# Patient Record
Sex: Female | Born: 1985 | Race: White | Hispanic: No | Marital: Single | Smoking: Current every day smoker
Health system: Southern US, Community
[De-identification: ages and names within clinical notes are randomized; demographics above are authoritative.]

## PROBLEM LIST (undated history)

## (undated) DIAGNOSIS — J45909 Unspecified asthma, uncomplicated: Secondary | ICD-10-CM

## (undated) DIAGNOSIS — K219 Gastro-esophageal reflux disease without esophagitis: Secondary | ICD-10-CM

---
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
## 2009-01-07 ENCOUNTER — Emergency Department (HOSPITAL_COMMUNITY): Admission: EM | Admit: 2009-01-07 | Discharge: 2009-01-07 | Payer: Self-pay | Admitting: Family Medicine
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
## 2014-03-14 ENCOUNTER — Other Ambulatory Visit (HOSPITAL_COMMUNITY): Payer: Self-pay | Admitting: Nurse Practitioner

## 2014-03-14 DIAGNOSIS — N949 Unspecified condition associated with female genital organs and menstrual cycle: Secondary | ICD-10-CM

## 2014-03-16 ENCOUNTER — Other Ambulatory Visit (HOSPITAL_COMMUNITY): Payer: Self-pay

## 2018-09-30 ENCOUNTER — Ambulatory Visit (INDEPENDENT_AMBULATORY_CARE_PROVIDER_SITE_OTHER): Payer: BLUE CROSS/BLUE SHIELD | Admitting: Internal Medicine

## 2018-09-30 ENCOUNTER — Encounter (INDEPENDENT_AMBULATORY_CARE_PROVIDER_SITE_OTHER): Payer: Self-pay | Admitting: Internal Medicine

## 2018-09-30 ENCOUNTER — Other Ambulatory Visit: Payer: Self-pay

## 2018-09-30 VITALS — BP 117/79 | HR 93 | Temp 98.7°F | Ht 63.0 in | Wt 222.5 lb

## 2018-09-30 DIAGNOSIS — R197 Diarrhea, unspecified: Secondary | ICD-10-CM | POA: Diagnosis not present

## 2018-09-30 NOTE — Progress Notes (Signed)
   Subjective:    Patient ID: Natasha Bright, female    DOB: [birth year], 33 y.o.   MRN: [id number]  HPI Referred by the [facility] for diarrhea. She has diarrhea everyday for a month. She said she had diarrhea everyday in February. She also had diarrhea in December. On average, she has 2-3 loose stools a day. She had diarrhea x 2 yesterday.  She has urgency. She is not taking a Probiotic.No recent antibiotics. She says she has a dull ache in her rt lower abdomen at times.   Review of Systems History reviewed. No pertinent past medical history.  History reviewed. No pertinent surgical history.  Allergies  Allergen Reactions  . Lactose Intolerance (Gi)     Current Outpatient Medications on File Prior to Visit  Medication Sig Dispense Refill  . Levonorgest-Eth Estrad 91-Day (ASHLYNA PO) Take by mouth.    . meloxicam (MOBIC) 7.5 MG tablet Take 7.5 mg by mouth as needed for pain.     No current facility-administered medications on file prior to visit.         Objective:   Physical Exam Blood pressure 117/79, pulse 93, temperature 98.7 F (37.1 C), height 5\' 3"  (1.6 m), weight 222 lb 8 oz (100.9 kg). Alert and oriented. Skin warm and dry. Oral mucosa is moist.   . Sclera anicteric, conjunctivae is pink. Thyroid not enlarged. No cervical lymphadenopathy. Lungs clear. Heart regular rate and rhythm.  Abdomen is soft. Bowel sounds are positive. No hepatomegaly. No abdominal masses felt. No tenderness.  No edema to lower extremities.         Assessment & Plan:  Diarrhea. Am going to get a GI pathogen. Needs to start a Probiotic Further recommendations to follow.

## 2018-09-30 NOTE — Patient Instructions (Addendum)
GI pathogen. Get a probiotic

## 2019-10-28 ENCOUNTER — Ambulatory Visit: Payer: Self-pay | Attending: Internal Medicine

## 2019-10-28 DIAGNOSIS — Z23 Encounter for immunization: Secondary | ICD-10-CM

## 2019-10-28 NOTE — Progress Notes (Signed)
   Covid-19 Vaccination Clinic  Name:  Natasha Bright    MRN: [id number] DOB: [birth year]  10/28/2019  [name] was observed post Covid-19 immunization for 15 minutes without incident. She was provided with Vaccine Information Sheet and instruction to access the V-Safe system.   [name] was instructed to call 911 with any severe reactions post vaccine: Marland Kitchen Difficulty breathing  . Swelling of face and throat  . A fast heartbeat  . A bad rash all over body  . Dizziness and weakness   Immunizations Administered    Name Date Dose VIS Date Route   Pfizer COVID-19 Vaccine 10/28/2019  4:55 PM 0.3 mL 06/30/2019 Intramuscular   Manufacturer: [employer], [employer]   Lot: [id number]   NDC: [address]-9

## 2019-11-20 ENCOUNTER — Ambulatory Visit: Payer: Self-pay | Attending: Internal Medicine

## 2019-11-20 DIAGNOSIS — Z23 Encounter for immunization: Secondary | ICD-10-CM

## 2019-11-20 NOTE — Progress Notes (Signed)
   Covid-19 Vaccination Clinic  Name:  Natasha Bright    MRN: [id number] DOB: [birth year]  11/20/2019  [name] was observed post Covid-19 immunization for 15 minutes without incident. She was provided with Vaccine Information Sheet and instruction to access the V-Safe system.   [name] was instructed to call 911 with any severe reactions post vaccine: Marland Kitchen Difficulty breathing  . Swelling of face and throat  . A fast heartbeat  . A bad rash all over body  . Dizziness and weakness   Immunizations Administered    Name Date Dose VIS Date Route   Pfizer COVID-19 Vaccine 11/20/2019  1:41 PM 0.3 mL 09/13/2018 Intramuscular   Manufacturer: [employer], [employer]   Lot: [id number]   NDC: [address]-5

## 2021-10-28 ENCOUNTER — Ambulatory Visit
Admission: EM | Admit: 2021-10-28 | Discharge: 2021-10-28 | Disposition: A | Discharge status: Home / Self Care | Payer: 59 | Attending: Urgent Care | Admitting: Urgent Care

## 2021-10-28 ENCOUNTER — Encounter: Payer: Self-pay | Admitting: Emergency Medicine

## 2021-10-28 DIAGNOSIS — J019 Acute sinusitis, unspecified: Secondary | ICD-10-CM | POA: Diagnosis not present

## 2021-10-28 DIAGNOSIS — R0981 Nasal congestion: Secondary | ICD-10-CM | POA: Diagnosis not present

## 2021-10-28 DIAGNOSIS — H9203 Otalgia, bilateral: Secondary | ICD-10-CM | POA: Diagnosis not present

## 2021-10-28 HISTORY — DX: Gastro-esophageal reflux disease without esophagitis: K21.9

## 2021-10-28 MED ORDER — DOXYCYCLINE HYCLATE 100 MG PO CAPS: 100.0000 mg | ORAL_CAPSULE | Freq: Two times a day (BID) | ORAL | 0 refills | Status: AC

## 2021-10-28 MED ORDER — LEVOCETIRIZINE DIHYDROCHLORIDE 5 MG PO TABS: 5.0000 mg | ORAL_TABLET | Freq: Every evening | ORAL | 0 refills | Status: AC

## 2021-10-28 NOTE — ED Provider Notes (Signed)
?[facility] ? ? ?MRN: [id number] DOB: 1985/11/27 ? ?Subjective:  ? ?Natasha Bright is a 36 y.o. female presenting for 2-week history of persistent sinus congestion, bilateral ear pain, sinus pressure, drainage, coughing.  Patient completed a round of antibiotics that she took for a tooth infection.  Has been using pseudoephedrine daily.  Reports that she gets bronchitis yearly but has not had this problem in the past couple of years.  No smoking.  No history of asthma.  No chest pain, shortness of breath or wheezing. ? ?No current facility-administered medications for this encounter. ? ?Current Outpatient Medications:  ?  Levonorgest-Eth Estrad 91-Day (ASHLYNA PO), Take by mouth., Disp: , Rfl:  ?  meloxicam (MOBIC) 7.5 MG tablet, Take 7.5 mg by mouth as needed for pain., Disp: , Rfl:   ? ?Allergies  ?Allergen Reactions  ? Lactose Intolerance (Gi)   ? ? ?Past Medical History:  ?Diagnosis Date  ? Acid reflux   ?  ? ?History reviewed. No pertinent surgical history. ? ?History reviewed. No pertinent family history. ? ?Social History  ? ?Tobacco Use  ? Smoking status: Never  ? Smokeless tobacco: Never  ?Substance Use Topics  ? Alcohol use: Yes  ?  Comment: twice a week or more  ? Drug use: Never  ? ? ?ROS ? ? ?Objective:  ? ?Vitals: ?BP (!) 163/108 (BP Location: Right Arm)   Pulse 85   Temp 98.1 ?F (36.7 ?C) (Oral)   Resp 18   SpO2 98%  ? ?Physical Exam ?Constitutional:   ?   General: She is not in acute distress. ?   Appearance: Normal appearance. She is well-developed and normal weight. She is not ill-appearing, toxic-appearing or diaphoretic.  ?HENT:  ?   Head: Normocephalic and atraumatic.  ?   Right Ear: Tympanic membrane, ear canal and external ear normal. No drainage or tenderness. No middle ear effusion. There is no impacted cerumen. Tympanic membrane is not erythematous.  ?   Left Ear: Tympanic membrane, ear canal and external ear normal. No drainage or tenderness.  No middle ear effusion.  There is no impacted cerumen. Tympanic membrane is not erythematous.  ?   Nose: Nose normal. No congestion or rhinorrhea.  ?   Mouth/Throat:  ?   Mouth: Mucous membranes are moist. No oral lesions.  ?   Pharynx: No pharyngeal swelling, oropharyngeal exudate, posterior oropharyngeal erythema or uvula swelling.  ?   Tonsils: No tonsillar exudate or tonsillar abscesses.  ?Eyes:  ?   General: No scleral icterus.    ?   Right eye: No discharge.     ?   Left eye: No discharge.  ?   Extraocular Movements: Extraocular movements intact.  ?   Right eye: Normal extraocular motion.  ?   Left eye: Normal extraocular motion.  ?   Conjunctiva/sclera: Conjunctivae normal.  ?Cardiovascular:  ?   Rate and Rhythm: Normal rate.  ?   Heart sounds: No murmur heard. ?  No friction rub. No gallop.  ?Pulmonary:  ?   Effort: Pulmonary effort is normal. No respiratory distress.  ?   Breath sounds: No stridor. No wheezing, rhonchi or rales.  ?Chest:  ?   Chest wall: No tenderness.  ?Musculoskeletal:  ?   Cervical back: Normal range of motion and neck supple.  ?Lymphadenopathy:  ?   Cervical: No cervical adenopathy.  ?Skin: ?   General: Skin is warm and dry.  ?Neurological:  ?   General: No focal  deficit present.  ?   Mental Status: She is alert and oriented to person, place, and time.  ?Psychiatric:     ?   Mood and Affect: Mood normal.     ?   Behavior: Behavior normal.  ? ? ?Assessment and Plan :  ? ?PDMP not reviewed this encounter. ? ?1. Acute non-recurrent sinusitis, unspecified location   ?2. Sinus congestion   ?3. Acute ear pain, bilateral   ? ? ?No signs of otitis media. Deferred imaging given clear cardiopulmonary exam, hemodynamically stable vital signs. Will start empiric treatment for sinusitis with doxycycline.  Recommended supportive care otherwise including the use of oral antihistamine, decongestant. Counseled patient on potential for adverse effects with medications prescribed/recommended today, ER and return-to-clinic  precautions discussed, patient verbalized understanding. ? ?  ?Wallis Bamberg, PA-C ?10/28/21 1227 ? ?

## 2021-10-28 NOTE — ED Triage Notes (Signed)
Hx of covid 2 months ago.  Just finished amoxil for tooth problem.  Runny nose sneezing, cough and scratchy throat.  Has been taking sudafed with out relief.   ?

## 2023-04-09 ENCOUNTER — Encounter: Payer: Self-pay | Admitting: Emergency Medicine

## 2023-04-09 ENCOUNTER — Ambulatory Visit
Admission: EM | Admit: 2023-04-09 | Discharge: 2023-04-09 | Disposition: A | Discharge status: Home / Self Care | Payer: Self-pay | Attending: Family Medicine | Admitting: Family Medicine

## 2023-04-09 ENCOUNTER — Other Ambulatory Visit: Payer: Self-pay

## 2023-04-09 DIAGNOSIS — S161XXA Strain of muscle, fascia and tendon at neck level, initial encounter: Secondary | ICD-10-CM

## 2023-04-09 DIAGNOSIS — N39498 Other specified urinary incontinence: Secondary | ICD-10-CM

## 2023-04-09 HISTORY — DX: Unspecified asthma, uncomplicated: J45.909

## 2023-04-09 LAB — POCT URINALYSIS DIP (MANUAL ENTRY)
Bilirubin, UA: NEGATIVE
Blood, UA: NEGATIVE
Glucose, UA: NEGATIVE mg/dL
Ketones, POC UA: NEGATIVE mg/dL
Leukocytes, UA: NEGATIVE
Nitrite, UA: NEGATIVE
Protein Ur, POC: NEGATIVE mg/dL
Spec Grav, UA: 1.02 (ref 1.010–1.025)
Urobilinogen, UA: 0.2 E.U./dL
pH, UA: 7.5 (ref 5.0–8.0)

## 2023-04-09 MED ORDER — NAPROXEN 500 MG PO TABS: 500.0000 mg | ORAL_TABLET | Freq: Two times a day (BID) | ORAL | 0 refills | Status: AC | PRN

## 2023-04-09 MED ORDER — TIZANIDINE HCL 4 MG PO CAPS: 4.0000 mg | ORAL_CAPSULE | Freq: Three times a day (TID) | ORAL | 0 refills | Status: AC | PRN
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
## 2023-04-18 ENCOUNTER — Other Ambulatory Visit: Payer: Self-pay | Admitting: Family Medicine

## 2023-04-19 NOTE — Telephone Encounter (Signed)
Urgent Care patient Requested Prescriptions  Pending Prescriptions Disp Refills   tiZANidine (ZANAFLEX) 4 MG tablet [Pharmacy Med Name: TIZANIDINE 4MG  TABLETS] 15 tablet     Sig: TAKE 1 TABLET BY MOUTH THREE TIMES DAILY AS NEEDED FOR MUSCLE SPASMS. DO NOT DRINK ALCOHOL OR DRIVE WHILE TAKING THIS. MAY CAUSE DROWSINESS     There is no refill protocol information for this order

## 2023-12-14 ENCOUNTER — Ambulatory Visit
Admission: EM | Admit: 2023-12-14 | Discharge: 2023-12-14 | Disposition: A | Discharge status: Home / Self Care | Attending: Family Medicine | Admitting: Family Medicine

## 2023-12-14 DIAGNOSIS — J0100 Acute maxillary sinusitis, unspecified: Secondary | ICD-10-CM | POA: Diagnosis not present

## 2023-12-14 DIAGNOSIS — J4531 Mild persistent asthma with (acute) exacerbation: Secondary | ICD-10-CM | POA: Diagnosis not present

## 2023-12-14 DIAGNOSIS — M25471 Effusion, right ankle: Secondary | ICD-10-CM

## 2023-12-14 DIAGNOSIS — J3089 Other allergic rhinitis: Secondary | ICD-10-CM | POA: Diagnosis not present

## 2023-12-14 DIAGNOSIS — M25472 Effusion, left ankle: Secondary | ICD-10-CM

## 2023-12-14 MED ORDER — PROMETHAZINE-DM 6.25-15 MG/5ML PO SYRP: 5.0000 mL | ORAL_SOLUTION | Freq: Four times a day (QID) | ORAL | 0 refills | Status: AC | PRN

## 2023-12-14 MED ORDER — ALBUTEROL SULFATE HFA 108 (90 BASE) MCG/ACT IN AERS: 2.0000 | INHALATION_SPRAY | RESPIRATORY_TRACT | 0 refills | Status: AC | PRN

## 2023-12-14 MED ORDER — DEXAMETHASONE SODIUM PHOSPHATE 10 MG/ML IJ SOLN
10.0000 mg | Freq: Once | INTRAMUSCULAR | Status: AC
  Administered 2023-12-14: 10 mg via INTRAMUSCULAR

## 2023-12-14 MED ORDER — CETIRIZINE HCL 10 MG PO TABS: 10.0000 mg | ORAL_TABLET | Freq: Every day | ORAL | 2 refills | Status: AC

## 2023-12-14 MED ORDER — ALBUTEROL SULFATE (2.5 MG/3ML) 0.083% IN NEBU
2.5000 mg | INHALATION_SOLUTION | Freq: Once | RESPIRATORY_TRACT | Status: AC
  Administered 2023-12-14: 2.5 mg via RESPIRATORY_TRACT

## 2023-12-14 MED ORDER — AZELASTINE HCL 0.1 % NA SOLN: 1.0000 | Freq: Two times a day (BID) | NASAL | 2 refills | Status: AC

## 2023-12-14 MED ORDER — AMOXICILLIN-POT CLAVULANATE 875-125 MG PO TABS: 1.0000 | ORAL_TABLET | Freq: Two times a day (BID) | ORAL | 0 refills | Status: AC

## 2023-12-14 NOTE — ED Triage Notes (Signed)
 Pt reports difficulty breathing,cough and congestion, x 1 week sx's seemed to have gotten worse on Friday.

## 2023-12-14 NOTE — ED Provider Notes (Signed)
 [facility]    CSN: [id number] Arrival date & time: 12/14/23  0945      History   Chief Complaint No chief complaint on file.   HPI Natasha Bright is a 38 y.o. female.   Patient presenting today with over a week of progressively worsening nasal congestion, facial pain and pressure, productive cough, chest tightness, wheezing, shortness of breath, ear pressure.  Denies fever, chest pain, abdominal pain, vomiting, diarrhea.  History of seasonal allergies and asthma not currently on regimen for these.  She is also complaining of bilateral ankle and foot swelling worse during the day while she is up on her feet for the past few weeks.  Notes she has traveled multiple times over the timeframe and has been on her feet quite often throughout the days.  Denies redness, rashes, numbness, tingling, pain and states both sides are swollen to the same degree.  No known history of DVT/PE.    Past Medical History:  Diagnosis Date   Acid reflux    Asthma    sports induced as a child.    There are no active problems to display for this patient.   History reviewed. No pertinent surgical history.  OB History   No obstetric history on file.      Home Medications    Prior to Admission medications   Medication Sig Start Date End Date Taking? Authorizing Provider  albuterol (VENTOLIN HFA) 108 (90 Base) MCG/ACT inhaler Inhale 2 puffs into the lungs every 4 (four) hours as needed. 12/14/23  Yes Corbin Dess, PA-C  amoxicillin-clavulanate (AUGMENTIN) 875-125 MG tablet Take 1 tablet by mouth every 12 (twelve) hours. 12/14/23  Yes Corbin Dess, PA-C  azelastine (ASTELIN) 0.1 % nasal spray Place 1 spray into both nostrils 2 (two) times daily. Use in each nostril as directed 12/14/23  Yes Corbin Dess, PA-C  cetirizine (ZYRTEC ALLERGY) 10 MG tablet Take 1 tablet (10 mg total) by mouth daily. 12/14/23  Yes Corbin Dess, PA-C   promethazine-dextromethorphan (PROMETHAZINE-DM) 6.25-15 MG/5ML syrup Take 5 mLs by mouth 4 (four) times daily as needed. 12/14/23  Yes Corbin Dess, PA-C  doxycycline  (VIBRAMYCIN ) 100 MG capsule Take 1 capsule (100 mg total) by mouth 2 (two) times daily. 10/28/21   Adolph Hoop, PA-C  levocetirizine (XYZAL ) 5 MG tablet Take 1 tablet (5 mg total) by mouth every evening. 10/28/21   Adolph Hoop, PA-C  Levonorgest-Eth Estrad 91-Day (ASHLYNA PO) Take by mouth. Patient not taking: Reported on 04/09/2023    [provider]  meloxicam (MOBIC) 7.5 MG tablet Take 7.5 mg by mouth as needed for pain.    [provider]  naproxen  (NAPROSYN ) 500 MG tablet Take 1 tablet (500 mg total) by mouth 2 (two) times daily as needed. 04/09/23   Corbin Dess, PA-C  tiZANidine  (ZANAFLEX ) 4 MG capsule Take 1 capsule (4 mg total) by mouth 3 (three) times daily as needed for muscle spasms. Do not drink alcohol or drive while taking this medication.  May cause drowsiness. 04/09/23   Corbin Dess, PA-C    Family History History reviewed. No pertinent family history.  Social History Social History   Tobacco Use   Smoking status: Every Day    Types: Cigarettes   Smokeless tobacco: Never  Substance Use Topics   Alcohol use: Yes    Comment: twice a week or more   Drug use: Never     Allergies   Lactose intolerance (gi)  Review of Systems Review of Systems PER HPI  Physical Exam Triage Vital Signs ED Triage Vitals  Encounter Vitals Group     BP 12/14/23 0950 (!) 169/83     Systolic BP Percentile --      Diastolic BP Percentile --      Pulse Rate 12/14/23 0950 89     Resp 12/14/23 0950 16     Temp 12/14/23 0950 98.2 F (36.8 C)     Temp Source 12/14/23 0950 Oral     SpO2 12/14/23 0950 93 %     Weight --      Height --      Head Circumference --      Peak Flow --      Pain Score 12/14/23 0956 5     Pain Loc --      Pain Education --      Exclude from Growth  Chart --    No data found.  Updated Vital Signs BP (!) 169/83 (BP Location: Right Arm)   Pulse 89   Temp 98.2 F (36.8 C) (Oral)   Resp 16   SpO2 93%   Visual Acuity Right Eye Distance:   Left Eye Distance:   Bilateral Distance:    Right Eye Near:   Left Eye Near:    Bilateral Near:     Physical Exam Vitals and nursing note reviewed.  Constitutional:      Appearance: Normal appearance. She is not ill-appearing.  HENT:     Head: Atraumatic.     Right Ear: Tympanic membrane and external ear normal.     Left Ear: Tympanic membrane and external ear normal.     Nose: Congestion present.     Mouth/Throat:     Mouth: Mucous membranes are moist.     Pharynx: Posterior oropharyngeal erythema present.  Eyes:     Extraocular Movements: Extraocular movements intact.     Conjunctiva/sclera: Conjunctivae normal.  Cardiovascular:     Rate and Rhythm: Normal rate and regular rhythm.     Heart sounds: Normal heart sounds.  Pulmonary:     Effort: Pulmonary effort is normal.     Breath sounds: Wheezing present.  Musculoskeletal:        General: Normal range of motion.     Cervical back: Normal range of motion and neck supple.     Comments: Trace edema bilateral ankles, negative Homans' sign  Skin:    General: Skin is warm and dry.     Findings: No erythema.  Neurological:     Mental Status: She is alert and oriented to person, place, and time.  Psychiatric:        Mood and Affect: Mood normal.        Thought Content: Thought content normal.        Judgment: Judgment normal.      UC Treatments / Results  Labs (all labs ordered are listed, but only abnormal results are displayed) Labs Reviewed - No data to display  EKG   Radiology No results found.  Procedures Procedures (including critical care time)  Medications Ordered in UC Medications  dexamethasone (DECADRON) injection 10 mg (has no administration in time range)  albuterol (PROVENTIL) (2.5 MG/3ML) 0.083%  nebulizer solution 2.5 mg (has no administration in time range)    Initial Impression / Assessment and Plan / UC Course  I have reviewed the triage vital signs and the nursing notes.  Pertinent labs & imaging results that were available during my  care of the patient were reviewed by me and considered in my medical decision making (see chart for details).     Regarding the ankle and foot edema, discussed compression hose, leg elevation, increasing fluid intake, weight loss.  Very low suspicion for DVT at this time so we will forego venous ultrasound with shared decision making.  Follow-up for worsening symptoms with this regard.  Will treat sinusitis with Augmentin, start Zyrtec and Astelin daily for seasonal allergy control and give IM Decadron, albuterol, Phenergan DM for asthma exacerbation.  Discussed supportive home care and return precautions.  Final Clinical Impressions(s) / UC Diagnoses   Final diagnoses:  Seasonal allergic rhinitis due to other allergic trigger  Mild persistent asthma with acute exacerbation  Acute non-recurrent maxillary sinusitis  Ankle edema, bilateral   Discharge Instructions   None    ED Prescriptions     Medication Sig Dispense Auth. Provider   amoxicillin-clavulanate (AUGMENTIN) 875-125 MG tablet Take 1 tablet by mouth every 12 (twelve) hours. 14 tablet Corbin Dess, PA-C   albuterol (VENTOLIN HFA) 108 (90 Base) MCG/ACT inhaler Inhale 2 puffs into the lungs every 4 (four) hours as needed. 18 g Corbin Dess, PA-C   cetirizine (ZYRTEC ALLERGY) 10 MG tablet Take 1 tablet (10 mg total) by mouth daily. 30 tablet Corbin Dess, PA-C   azelastine (ASTELIN) 0.1 % nasal spray Place 1 spray into both nostrils 2 (two) times daily. Use in each nostril as directed 30 mL Corbin Dess, PA-C   promethazine-dextromethorphan (PROMETHAZINE-DM) 6.25-15 MG/5ML syrup Take 5 mLs by mouth 4 (four) times daily as needed. 100 mL Corbin Dess, [place]      PDMP not reviewed this encounter.   Digna, [place], [place] 12/14/23 1032

## 2023-12-27 ENCOUNTER — Other Ambulatory Visit: Payer: Self-pay | Admitting: Family Medicine

## 2024-03-21 ENCOUNTER — Other Ambulatory Visit: Payer: Self-pay

## 2024-03-21 ENCOUNTER — Ambulatory Visit
Admission: EM | Admit: 2024-03-21 | Discharge: 2024-03-21 | Disposition: A | Discharge status: Home / Self Care | Attending: Family Medicine | Admitting: Family Medicine

## 2024-03-21 ENCOUNTER — Encounter: Payer: Self-pay | Admitting: Emergency Medicine

## 2024-03-21 ENCOUNTER — Ambulatory Visit (INDEPENDENT_AMBULATORY_CARE_PROVIDER_SITE_OTHER)

## 2024-03-21 DIAGNOSIS — M79672 Pain in left foot: Secondary | ICD-10-CM | POA: Diagnosis not present

## 2024-03-21 MED ORDER — ACETAMINOPHEN 500 MG PO TABS
1000.0000 mg | ORAL_TABLET | Freq: Once | ORAL | Status: AC
  Administered 2024-03-21: 1000 mg via ORAL
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
## 2024-03-21 NOTE — Discharge Instructions (Signed)
 Your x-ray was negative for broken bones in your left foot.  We have given you a postop shoe to help with comfort with walking, you may also do rest, ice, compression, elevation
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
## 2024-06-29 ENCOUNTER — Ambulatory Visit

## 2024-06-29 DIAGNOSIS — B351 Tinea unguium: Secondary | ICD-10-CM | POA: Diagnosis not present

## 2024-06-29 DIAGNOSIS — D239 Other benign neoplasm of skin, unspecified: Secondary | ICD-10-CM | POA: Diagnosis not present

## 2024-06-29 DIAGNOSIS — L710 Perioral dermatitis: Secondary | ICD-10-CM

## 2024-06-29 DIAGNOSIS — B354 Tinea corporis: Secondary | ICD-10-CM | POA: Diagnosis not present

## 2024-06-29 DIAGNOSIS — B353 Tinea pedis: Secondary | ICD-10-CM | POA: Diagnosis not present

## 2024-06-29 MED ORDER — PIMECROLIMUS 1 % EX CREA: TOPICAL_CREAM | CUTANEOUS | 3 refills | Status: AC

## 2024-06-29 MED ORDER — TERBINAFINE HCL 250 MG PO TABS: 250.0000 mg | ORAL_TABLET | Freq: Every day | ORAL | 0 refills | Status: AC

## 2024-06-29 MED ORDER — TERBINAFINE HCL 250 MG PO TABS: 250.0000 mg | ORAL_TABLET | Freq: Every day | ORAL | 0 refills | Status: DC

## 2024-06-29 MED ORDER — TERBINAFINE HCL 1 % EX CREA: 1.0000 | TOPICAL_CREAM | Freq: Two times a day (BID) | CUTANEOUS | 3 refills | Status: AC

## 2024-06-29 NOTE — Patient Instructions (Addendum)
 - Perioral (periorificial) dermatitis: Use minimal products, gentle cleansers & gentle moisturizers. Discontinue toners, can cause more irritation to skin.   Moisturizer: Apply a moisturizer throughout the day and after bathing.  When you moisturize after bathing, this locks in the moisture.  This can lead to softer and smoother skin.  Body moisturizers come in ointments, creams, and lotions.  If you have dry skin, we recommend the use of ointments or creams rather than lotions.  In other words, something you scoop out of a jar rather than squirted out.  Ointments and creams are thicker and thus provide better moisturization.      Moisturizers Apply a moisturizer to your skin at least once a day (even if you do not bathe).   Cool moisturizers on your skin help with itching (to accomplish this, place your moisturizers and medicated creams in the refrigerator). In general, people with dry skin need a moisturizer that is scooped and not squirted.  - Ointments (petrolatum ointment): greasy, but are the best moisturizers Vaseline, Aquaphor  - Creams (thick, white cream that comes in a jar and is scooped with your hand) Cerave, Cetaphil, Eucerin, Vanicream  - Lotions (comes in a pump) is the weakest moisturizer but is an acceptable choice for the face if you have an oily face Cetaphil, Cerave, Curel, Neutrogena, Lubriderm, Aveeno     Due to recent changes in healthcare laws, you may see results of your pathology and/or laboratory studies on MyChart before the doctors have had a chance to review them. We understand that in some cases there may be results that are confusing or concerning to you. Please understand that not all results are received at the same time and often the doctors may need to interpret multiple results in order to provide you with the best plan of care or course of treatment. Therefore, we ask that you please give us  2 business days to thoroughly review all your results before  contacting the office for clarification. Should we see a critical lab result, you will be contacted sooner.   If You Need Anything After Your Visit  If you have any questions or concerns for your doctor, please call our main line at [phone] and press option 4 to reach your doctor's medical assistant. If no one answers, please leave a voicemail as directed and we will return your call as soon as possible. Messages left after 4 pm will be answered the following business day.   You may also send us  a message via MyChart. We typically respond to MyChart messages within 1-2 business days.  For prescription refills, please ask your pharmacy to contact our office. Our fax number is [phone].  If you have an urgent issue when the clinic is closed that cannot wait until the next business day, you can page your doctor at the number below.    Please note that while we do our best to be available for urgent issues outside of office hours, we are not available 24/7.   If you have an urgent issue and are unable to reach us , you may choose to seek medical care at your doctor's office, retail clinic, urgent care center, or emergency room.  If you have a medical emergency, please immediately call 911 or go to the emergency department.  Pager Numbers  - Dr. Hester: [phone]  - Dr. Jackquline: [phone]  - Dr. Claudene: [phone]   In the event of inclement weather, please call our main line at [phone] for an update  on the status of any delays or closures.  Dermatology Medication Tips: Please keep the boxes that topical medications come in in order to help keep track of the instructions about where and how to use these. Pharmacies typically print the medication instructions only on the boxes and not directly on the medication tubes.   If your medication is too expensive, please contact our office at [phone] option 4 or send us  a message through MyChart.   We are unable to tell  what your co-pay for medications will be in advance as this is different depending on your insurance coverage. However, we may be able to find a substitute medication at lower cost or fill out paperwork to get insurance to cover a needed medication.   If a prior authorization is required to get your medication covered by your insurance company, please allow us  1-2 business days to complete this process.  Drug prices often vary depending on where the prescription is filled and some pharmacies may offer cheaper prices.  The website www.goodrx.com contains coupons for medications through different pharmacies. The prices here do not account for what the cost may be with help from insurance (it may be cheaper with your insurance), but the website can give you the price if you did not use any insurance.  - You can print the associated coupon and take it with your prescription to the pharmacy.  - You may also stop by our office during regular business hours and pick up a GoodRx coupon card.  - If you need your prescription sent electronically to a different pharmacy, notify our office through [facility] or by phone at [phone] option 4.     Si Usted Necesita Algo Despus de Su Visita  Tambin puede enviarnos un mensaje a travs de Clinical Cytogeneticist. Por lo general respondemos a los mensajes de MyChart en el transcurso de 1 a 2 das hbiles.  Para renovar recetas, por favor pida a su farmacia que se ponga en contacto con nuestra oficina. Randi lakes de fax es [place] [phone].  Si tiene un asunto urgente cuando la clnica est cerrada y que no puede esperar hasta el siguiente da hbil, puede llamar/localizar a su doctor(a) al nmero que aparece a continuacin.   Por favor, tenga en cuenta que aunque hacemos todo lo posible para estar disponibles para asuntos urgentes fuera del horario de [place], no estamos disponibles las 24 horas del da, los 7 [address]  [address] de la [place].   Si tiene un problema  urgente y no puede comunicarse con nosotros, puede optar por buscar atencin mdica  en el consultorio de su doctor(a), en una clnica privada, en un centro de atencin urgente o en una sala de emergencias.  Si tiene engineer, drilling, por favor llame inmediatamente al 911 o vaya a la sala de emergencias.  Nmeros de bper  - Dr. Hester: [phone]  - Dra. Jackquline: [phone]  - Dr. Claudene: [phone]   En caso de inclemencias del tiempo, por favor llame a landry capes principal al [phone] para una actualizacin sobre el Mina de cualquier retraso o cierre.  Consejos para la medicacin en dermatologa: Por favor, guarde las cajas en las que vienen los medicamentos de uso tpico para ayudarle a seguir las instrucciones sobre dnde y cmo usarlos. Las farmacias generalmente imprimen las instrucciones del medicamento slo en las cajas y no directamente en los tubos del [place].   Si su medicamento es pepco holdings, por favor, pngase en contacto con nuestra oficina llamando  al [phone] y presione la opcin 4 o envenos un mensaje a travs de Clinical Cytogeneticist.   No podemos decirle cul ser su copago por los medicamentos por adelantado ya que esto es diferente dependiendo de la cobertura de su seguro. Sin embargo, es posible que podamos encontrar un medicamento sustituto a audiological scientist un formulario para que el seguro cubra el medicamento que se considera necesario.   Si se requiere una autorizacin previa para que su compaa de seguros cubra su medicamento, por favor permtanos de 1 a 2 das hbiles para completar este proceso.  Los precios de los medicamentos varan con frecuencia dependiendo del environmental consultant de dnde se surte la receta y alguna farmacias pueden ofrecer precios ms baratos.  El sitio web www.goodrx.com tiene cupones para medicamentos de health and safety inspector. Los precios aqu no tienen en cuenta lo que podra costar con la ayuda del seguro (puede ser ms barato  con su seguro), pero el sitio web puede darle el precio si no utiliz tourist information centre manager.  - Puede imprimir el cupn correspondiente y llevarlo con su receta a la farmacia.  - Tambin puede pasar por nuestra oficina durante el horario de atencin regular y education officer, museum una tarjeta de cupones de GoodRx.  - Si necesita que su receta se enve electrnicamente a una farmacia diferente, informe a nuestra oficina a travs de MyChart de [place] o por telfono llamando al [phone] y presione la opcin 4.

## 2024-06-29 NOTE — Progress Notes (Signed)
 Subjective   Natasha Bright is a 38 y.o. female who presents for the following: Total body skin exam for skin cancer screening and mole check. The patient has spots, moles and lesions to be evaluated, some may be new or changing and the patient may have concern these could be cancer. Patient is new patient.  Today patient reports: LOC at right lower leg present for a couple years, rash at bottom that comes & goes. Patient curious to know if she has rosacea, does get bumps at face. No personal hx of skin cancer. Bump at nose removed years ago but came back benign.   Review of Systems:    No other skin or systemic complaints except as noted in HPI or Assessment and Plan.  The following portions of the chart were reviewed this encounter and updated as appropriate: medications, allergies, medical history  Relevant Medical History:  n/a   Objective  (SKPE) Well appearing patient in no apparent distress; mood and affect are within normal limits. Examination was performed of the: Waist Up Skin Exam: scalp, head, eyes, ears, nose, lips, neck, chest, axillae, upper extremities, abdomen, back, hands, fingers, fingernails , buttocks.  Examination notable for:  - Monomorphic erythematous papules on perialar skin and chin. - Dermatofibroma(s): Firm dermal nodule(s) with a hyperpigmented halo and a positive dimple sign located on the lower legs. Tinea corporis - buttock Tinea pedis - feet  Examination limited by: Undergarments     Assessment & Plan  (SKAP)   Benign Lesions/ Findings:  - Dermatofibromas - Reassurance provided regarding the benign appearance of lesions noted on exam today; no treatment is indicated in the absence of symptoms/changes. - Reinforced importance of photoprotective strategies including liberal and frequent sunscreen use of a broad-spectrum SPF 30 or greater, use of protective clothing, and sun avoidance for prevention of cutaneous malignancy and photoaging.  Counseled  patient on the importance of regular self-skin monitoring as well as routine clinical skin examinations as scheduled.   Perioral (periorificial) dermatitis - chronic, flaring, not at goal  - Explained to patient that its a common facial skin problem in which groups of itchy or tender small red papules (bumps) appear around the mouth. - Recommended patient discontinue applying all face creams including topical steroids, cosmetics - The rash may get worse for a few days before it starts to improve. - D/c toners and dermaplaning, advised can cause more irritation.  - Recommend using minimal products, gentle cleansers & gentle moisturizers. - Start pimecrolimus 1% cream, apply to areas at face.   Tinea corporis - buttock Tinea pedis - feet Onychomychosis  - chronic, flaring, not at goal  - Start oral terbinafine - Dose: 250mg  daily for 2 weeks - discussed may extend pending response  - LFT's normal per previous studiesin 2019, asymptomatic, not alcoholic - Advised to monitoring for side effects (anorexia, abdominal pain, jaundice with dark urine, yellowing skin/sclera) and to stop medication immediately if develops this or dysguesia (metallic), or a hypersensitivity rash - Start topical terbinafine 1% cream   Level of service outlined above   Patient instructions (SKPI)   Procedures, orders, diagnosis for this visit:    There are no diagnoses linked to this encounter.  Return to clinic: Return for 3-4 months periorificial dermatitis, tinea , w/ Dr. Raymund.  I, Jacquelynn V. Wilfred, CMA, am acting as scribe for Lauraine JAYSON Raymund, MD.  Documentation: I have reviewed the above documentation for accuracy and completeness, and I agree with the above.  Lauraine JAYSON Kanaris, MD

## 2024-06-29 NOTE — Addendum Note (Signed)
 Addended by: RAYMUND DOMINO C on: 06/29/2024 12:13 PM   Modules accepted: Orders

## 2024-07-04 ENCOUNTER — Telehealth: Payer: Self-pay

## 2024-07-04 DIAGNOSIS — L710 Perioral dermatitis: Secondary | ICD-10-CM

## 2024-07-04 MED ORDER — METRONIDAZOLE 0.75 % EX CREA: TOPICAL_CREAM | Freq: Every day | CUTANEOUS | 3 refills | Status: AC

## 2024-07-04 NOTE — Telephone Encounter (Signed)
 Patient called about 2 prescriptions sent in for her today. Pimecrolimus  ointment was prescribed for her perioral dermatitis. She states she was unable to get rx due to price and insurance did not want to cover. Please advise.  Second medication was terbinafine  topical cream to use for Tinea pedis. She states told by pharmacy they do not have cream in stock. Would you recommend other treatment please advise.

## 2024-07-04 NOTE — Telephone Encounter (Signed)
 Called patient back and discussed change in medication and discussed can get otc topical terbinafine  at any drugstore or [facility].   Per Dr. Raymund message see below Can send metrocream     Should be able to buy topical terbinafine  (athlete foot cream, lamisil ) OTC at any drugstore or on [facility]

## 2024-08-22 ENCOUNTER — Encounter (INDEPENDENT_AMBULATORY_CARE_PROVIDER_SITE_OTHER): Payer: Self-pay | Admitting: *Deleted

## 2024-09-28 ENCOUNTER — Ambulatory Visit

## 2024-10-05 ENCOUNTER — Ambulatory Visit (INDEPENDENT_AMBULATORY_CARE_PROVIDER_SITE_OTHER): Admitting: Gastroenterology
